# Patient Record
Sex: Male | Born: 1995 | Race: White | Hispanic: No | Marital: Single | Smoking: Never smoker
Health system: Southern US, Community
[De-identification: ages and names within clinical notes are randomized; demographics above are authoritative.]

## PROBLEM LIST (undated history)

## (undated) ENCOUNTER — Ambulatory Visit: Admission: EM | Source: Home / Self Care

## (undated) DIAGNOSIS — R569 Unspecified convulsions: Secondary | ICD-10-CM

## (undated) HISTORY — PX: NO PAST SURGERIES: SHX2092

## (undated) HISTORY — DX: Unspecified convulsions: R56.9

---
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
## 2020-12-12 ENCOUNTER — Emergency Department
Admission: EM | Admit: 2020-12-12 | Discharge: 2020-12-12 | Disposition: A | Discharge status: Home / Self Care | Payer: No Typology Code available for payment source | Attending: Emergency Medicine | Admitting: Emergency Medicine

## 2020-12-12 ENCOUNTER — Encounter: Payer: Self-pay | Admitting: Emergency Medicine

## 2020-12-12 ENCOUNTER — Other Ambulatory Visit: Payer: Self-pay

## 2020-12-12 DIAGNOSIS — Y33XXXA Other specified events, undetermined intent, initial encounter: Secondary | ICD-10-CM | POA: Insufficient documentation

## 2020-12-12 DIAGNOSIS — H5711 Ocular pain, right eye: Secondary | ICD-10-CM | POA: Diagnosis present

## 2020-12-12 DIAGNOSIS — S0501XA Injury of conjunctiva and corneal abrasion without foreign body, right eye, initial encounter: Secondary | ICD-10-CM | POA: Insufficient documentation

## 2020-12-12 MED ORDER — HYDROCODONE-ACETAMINOPHEN 5-325 MG PO TABS: 1.0000 | ORAL_TABLET | Freq: Four times a day (QID) | ORAL | 0 refills | Status: DC | PRN

## 2020-12-12 MED ORDER — TOBRAMYCIN 0.3 % OP OINT
TOPICAL_OINTMENT | Freq: Once | OPHTHALMIC | Status: DC
  Filled 2020-12-12: qty 3.5

## 2020-12-12 MED ORDER — TOBRAMYCIN 0.3 % OP SOLN: 1.0000 [drp] | Freq: Four times a day (QID) | OPHTHALMIC | 0 refills | Status: AC

## 2020-12-12 MED ORDER — FLUORESCEIN SODIUM 1 MG OP STRP
1.0000 | ORAL_STRIP | Freq: Once | OPHTHALMIC | Status: AC
  Administered 2020-12-12: 06:00:00 1 via OPHTHALMIC
  Filled 2020-12-12: qty 1

## 2020-12-12 MED ORDER — TOBRAMYCIN 0.3 % OP OINT
TOPICAL_OINTMENT | Freq: Four times a day (QID) | OPHTHALMIC | Status: DC
  Filled 2020-12-12: qty 3.5

## 2020-12-12 MED ORDER — ERYTHROMYCIN 5 MG/GM OP OINT: 1.0000 "application " | TOPICAL_OINTMENT | Freq: Three times a day (TID) | OPHTHALMIC | 0 refills | Status: AC

## 2020-12-12 MED ORDER — TETRACAINE HCL 0.5 % OP SOLN
2.0000 [drp] | Freq: Once | OPHTHALMIC | Status: AC
  Administered 2020-12-12: 2 [drp] via OPHTHALMIC
  Filled 2020-12-12: qty 4

## 2020-12-12 MED ORDER — ERYTHROMYCIN 5 MG/GM OP OINT: TOPICAL_OINTMENT | Freq: Once | OPHTHALMIC | Status: DC

## 2020-12-12 NOTE — ED Triage Notes (Signed)
Pt to triage via w/c, appears uncomfortable; st awoke yesterday am with "haze" over rt eye; put contacts in and pain increased throughout the day; removed contact prior to bed; awoke PTA with sharp pain to rt eye

## 2020-12-12 NOTE — ED Provider Notes (Signed)
[facility] Emergency Department Provider Note  Time seen: 4:48 AM  I have reviewed the triage vital signs and the nursing notes.   HISTORY  Chief Complaint Eye Pain   HPI Terry Pope is a 24 y.o. male with no past medical history presents to the emergency department for right eye pain.  According to the patient yesterday he awoke with a white haze in his right eye vision and some mild discomfort to the right eye.  Patient states he does not have his glasses with him so he wore contacts yesterday and today.  Patient states today the pain became sharp and increased throughout the day.  Patient took his contacts out but still having severe pain in the right eye.  Patient presents to the emergency department for significant pain.  States he has removed his contact lenses.   History reviewed. No pertinent past medical history.  There are no problems to display for this patient.   History reviewed. No pertinent surgical history.  Prior to Admission medications   Not on File    No Known Allergies  No family history on file.  Social History Social History   Tobacco Use  . Smoking status: Never Smoker  . Smokeless tobacco: Never Used    Review of Systems Constitutional: Negative for fever. Eyes: Significant pain in right eye. ENT: Negative for recent illness Cardiovascular: Negative for chest pain. Respiratory: Negative for shortness of breath. Gastrointestinal: Negative for abdominal pain Musculoskeletal: Negative for musculoskeletal complaints Neurological: Negative for headache All other ROS negative  ____________________________________________   PHYSICAL EXAM:  VITAL SIGNS: ED Triage Vitals  Enc Vitals Group     BP 12/12/20 0412 134/67     Pulse Rate 12/12/20 0412 (!) 50     Resp 12/12/20 0412 18     Temp 12/12/20 0412 98.1 F (36.7 C)     Temp Source 12/12/20 0412 Oral     SpO2 12/12/20 0412 100 %     Weight 12/12/20 0409 168  lb (76.2 kg)     Height 12/12/20 0409 5\' 8"  (1.727 m)     Head Circumference --      Peak Flow --      Pain Score 12/12/20 0409 8     Pain Loc --      Pain Edu? --      Excl. in GC? --    Constitutional: Alert and oriented. Well appearing and in no distress. Eyes: Unable to look at eyes due to pain keep his eye closed shut.  Was able to place tetracaine in the eye, periods of conjunctival injection.  Remainder of the eye exam pending. ENT      Head: Normocephalic and atraumatic.      Mouth/Throat: Mucous membranes are moist. Cardiovascular: Normal rate, regular rhythm Respiratory: Normal respiratory effort without tachypnea nor retractions. Breath sounds are clear Gastrointestinal: Soft and nontender. No distention Musculoskeletal: Nontender with normal range of motion in all extremities.  Neurologic:  Normal speech and language. No gross focal neurologic deficits  Skin:  Skin is warm, dry and intact.  Psychiatric: Mood and affect are normal  ____________________________________________   INITIAL IMPRESSION / ASSESSMENT AND PLAN / ED COURSE  Pertinent labs & imaging results that were available during my care of the patient were reviewed by me and considered in my medical decision making (see chart for details).   Patient presents to the emergency department for severe right eye pain.  States yesterday he developed a  white film over the eye continue to wear contacts yesterday and today now the pain has become severe.  Was able to take the contacts out but continued to have severe pain in the right eye.  Differential would include corneal abrasion, corneal ulceration, acute angle-closure glaucoma.  I have placed tetracaine in the eye to allow for better exam.  We will check tonometry pressures as well as dye the eye with fluorescein and Woods lamp exam.  Tonometry pressures between 5 and 9 obtained in the right eye.  After fluorescein staining patient has a fairly significant scleral  abrasion on each side of the cornea and appears to have a small corneal abrasion to the lower left aspect of the cornea.  No sign of corneal ulceration.  Patient states resolution of pain following tetracaine application.  We will place the patient on erythromycin ointment as well as tobramycin drops as the patient wears contact lenses.  I discussed with the patient avoiding contact lenses for the next 2 weeks.  I also discussed call the number provided for ophthalmology to arrange an eye exam within the next 48 hours.  Discussed return precautions.  Patient agreeable to plan of care.  We will discharge with short course of pain medication.  [name] was evaluated in Emergency Department on 12/12/2020 for the symptoms described in the history of present illness. He was evaluated in the context of the global COVID-19 pandemic, which necessitated consideration that the patient might be at risk for infection with the SARS-CoV-2 virus that causes COVID-19. Institutional protocols and algorithms that pertain to the evaluation of patients at risk for COVID-19 are in a state of rapid change based on information released by regulatory bodies including the CDC and federal and state organizations. These policies and algorithms were followed during the patient's care in the ED.  ____________________________________________   FINAL CLINICAL IMPRESSION(S) / ED DIAGNOSES  Corneal abrasion   Minna Antis, MD 12/12/20 [phone]

## 2020-12-12 NOTE — Discharge Instructions (Signed)
Please call the number provided for ophthalmology to arrange a follow-up appointment either today or tomorrow for an eye examination.  Please use your antibiotic drops and ointment as prescribed.  Please take your pain medication as needed, as written.  Return to the emergency department for any worsening pain, visual deficits, or any other symptom personally concerning to yourself.

## 2021-04-25 ENCOUNTER — Encounter: Payer: Self-pay | Admitting: Internal Medicine

## 2021-04-25 ENCOUNTER — Other Ambulatory Visit: Payer: Self-pay

## 2021-04-25 ENCOUNTER — Ambulatory Visit (INDEPENDENT_AMBULATORY_CARE_PROVIDER_SITE_OTHER): Payer: No Typology Code available for payment source | Admitting: Internal Medicine

## 2021-04-25 DIAGNOSIS — R5383 Other fatigue: Secondary | ICD-10-CM | POA: Diagnosis not present

## 2021-04-25 DIAGNOSIS — M5442 Lumbago with sciatica, left side: Secondary | ICD-10-CM | POA: Diagnosis not present

## 2021-04-25 DIAGNOSIS — Z8619 Personal history of other infectious and parasitic diseases: Secondary | ICD-10-CM

## 2021-04-25 DIAGNOSIS — G479 Sleep disorder, unspecified: Secondary | ICD-10-CM

## 2021-04-25 DIAGNOSIS — Z8616 Personal history of COVID-19: Secondary | ICD-10-CM

## 2021-04-25 LAB — CBC WITH DIFFERENTIAL/PLATELET
Basophils Absolute: 0 10*3/uL (ref 0.0–0.1)
Basophils Relative: 0.2 % (ref 0.0–3.0)
Eosinophils Absolute: 0 10*3/uL (ref 0.0–0.7)
Eosinophils Relative: 0 % (ref 0.0–5.0)
HCT: 45.2 % (ref 39.0–52.0)
Hemoglobin: 14.9 g/dL (ref 13.0–17.0)
Lymphocytes Relative: 17.3 % (ref 12.0–46.0)
Lymphs Abs: 1 10*3/uL (ref 0.7–4.0)
MCHC: 32.9 g/dL (ref 30.0–36.0)
MCV: 86.3 fl (ref 78.0–100.0)
Monocytes Absolute: 0.1 10*3/uL (ref 0.1–1.0)
Monocytes Relative: 1.9 % — ABNORMAL LOW (ref 3.0–12.0)
Neutro Abs: 4.5 10*3/uL (ref 1.4–7.7)
Neutrophils Relative %: 80.6 % — ABNORMAL HIGH (ref 43.0–77.0)
Platelets: 242 10*3/uL (ref 150.0–400.0)
RBC: 5.24 Mil/uL (ref 4.22–5.81)
RDW: 13.6 % (ref 11.5–15.5)
WBC: 5.6 10*3/uL (ref 4.0–10.5)

## 2021-04-25 LAB — COMPREHENSIVE METABOLIC PANEL
ALT: 17 U/L (ref 0–53)
AST: 19 U/L (ref 0–37)
Albumin: 4.9 g/dL (ref 3.5–5.2)
Alkaline Phosphatase: 51 U/L (ref 39–117)
BUN: 15 mg/dL (ref 6–23)
CO2: 30 mEq/L (ref 19–32)
Calcium: 10 mg/dL (ref 8.4–10.5)
Chloride: 102 mEq/L (ref 96–112)
Creatinine, Ser: 1.11 mg/dL (ref 0.40–1.50)
GFR: 93.71 mL/min (ref >60.00)
Glucose, Bld: 100 mg/dL — ABNORMAL HIGH (ref 70–99)
Potassium: 4.5 mEq/L (ref 3.5–5.1)
Sodium: 139 mEq/L (ref 135–145)
Total Bilirubin: 0.3 mg/dL (ref 0.2–1.2)
Total Protein: 7.3 g/dL (ref 6.0–8.3)

## 2021-04-25 LAB — TSH: TSH: 1.45 u[IU]/mL (ref 0.35–4.50)

## 2021-04-25 NOTE — Progress Notes (Signed)
Patient ID: Terry Pope, male   DOB: 1996-12-05, 25 y.o.   MRN: [id number]   Subjective:    Patient ID: Terry Pope, male    DOB: 06-Apr-1996, 25 y.o.   MRN: [id number]  HPI This visit occurred during the SARS-CoV-2 public health emergency.  Safety protocols were in place, including screening questions prior to the visit, additional usage of staff PPE, and extensive cleaning of exam room while observing appropriate contact time as indicated for disinfecting solutions.  Patient here to establish care.  He is due to graduate in one week.  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  Works out daily.  No chest pain or sob with increased activity or exertion.  No acid reflux.  No abdominal pain.  He does report that he was working out five days ago.  Working with [employer].  Felt his back pop.  Left side felt numb.  Numbness lasted 3-5 minutes.  Pain in left buttock and extending down leg.  Worse with standing.  Went to acute care.  Xray - unrevealing per report.  Given prednisone taper.  Is some better.  Pain worse with going from sitting to standing.  Also reports that he has had covid three times - has tested positive.  Symptoms with covid - fever, ears hurt, head stuffiness and sore throat.  He also reports that one time per month  - develops sore throat and feels bad for one week.  Discussed recurring infections.  Discussed ENT referral.  Notices occasional night sweats.  No increased cough or congestion.  Had questions about covid vaccine.  Planning to travel out of the country.  Takes melatonin to help with sleep.  Has also noticed a lump - left side abdomen. Present maybe for one year.  Not tender.  May be a little larger.   Past Medical History:  Diagnosis Date  . Seizures (HCC)    AGE 59 AND UNDER - febrile   Past Surgical History:  Procedure Laterality Date  . NO PAST SURGERIES     Family History  Problem Relation Age of Onset  . Asthma Mother   . Hypertension Maternal Grandmother   . Cancer  Maternal Grandfather   . Hearing loss Paternal Grandfather   . Hyperlipidemia Paternal Grandfather   . Hypertension Paternal Grandfather   . Stroke Paternal Grandfather    Social History   Socioeconomic History  . Marital status: Single    Spouse name: Not on file  . Number of children: Not on file  . Years of education: Not on file  . Highest education level: Not on file  Occupational History  . Not on file  Tobacco Use  . Smoking status: Never Smoker  . Smokeless tobacco: Never Used  Vaping Use  . Vaping Use: Never used  Substance and Sexual Activity  . Alcohol use: Not Currently  . Drug use: Never  . Sexual activity: Not Currently  Other Topics Concern  . Not on file  Social History Narrative  . Not on file   Social Determinants of Health   Financial Resource Strain: Not on file  Food Insecurity: Not on file  Transportation Needs: Not on file  Physical Activity: Not on file  Stress: Not on file  Social Connections: Not on file    Outpatient Encounter Medications as of 04/25/2021  Medication Sig  . [DISCONTINUED] HYDROcodone-acetaminophen (NORCO/VICODIN) 5-325 MG tablet Take 1 tablet by mouth every 6 (six) hours as needed for moderate pain. (Patient not taking: Reported on 04/25/2021)  No facility-administered encounter medications on file as of 04/25/2021.    Review of Systems  Constitutional: Negative for appetite change and unexpected weight change.  HENT: Negative for congestion and sinus pressure.   Respiratory: Negative for cough, chest tightness and shortness of breath.   Cardiovascular: Negative for chest pain, palpitations and leg swelling.  Gastrointestinal: Negative for abdominal pain, diarrhea, nausea and vomiting.  Genitourinary: Negative for difficulty urinating and dysuria.  Musculoskeletal: Positive for back pain. Negative for joint swelling and myalgias.  Skin: Negative for color change and rash.  Neurological: Negative for dizziness,  light-headedness and headaches.  Psychiatric/Behavioral: Negative for agitation and dysphoric mood.       Objective:    Physical Exam Vitals reviewed.  Constitutional:      General: He is not in acute distress.    Appearance: Normal appearance. He is well-developed.  HENT:     Head: Normocephalic and atraumatic.     Right Ear: External ear normal.     Left Ear: External ear normal.  Eyes:     General: No scleral icterus.       Right eye: No discharge.        Left eye: No discharge.     Conjunctiva/sclera: Conjunctivae normal.  Cardiovascular:     Rate and Rhythm: Normal rate and regular rhythm.  Pulmonary:     Effort: Pulmonary effort is normal. No respiratory distress.     Breath sounds: Normal breath sounds.  Abdominal:     General: Bowel sounds are normal.     Palpations: Abdomen is soft.     Tenderness: There is no abdominal tenderness.  Musculoskeletal:        General: No swelling or tenderness.     Cervical back: Neck supple. No tenderness.  Lymphadenopathy:     Cervical: No cervical adenopathy.  Skin:    Findings: No erythema or rash.     Comments: Increased soft tissue  - localized - left side.  Non tender.  No erythema.   Neurological:     Mental Status: He is alert.  Psychiatric:        Mood and Affect: Mood normal.        Behavior: Behavior normal.     BP 118/70   Pulse 72   Temp 98 F (36.7 C)   Resp 16   Ht 5\' 8"  (1.727 m)   Wt 181 lb (82.1 kg)   SpO2 99%   BMI 27.52 kg/m  Wt Readings from Last 3 Encounters:  04/25/21 181 lb (82.1 kg)  12/12/20 168 lb (76.2 kg)        Assessment & Plan:   Problem List Items Addressed This Visit    Difficulty sleeping    Taking melatonin.  Helps.        Fatigue   Relevant Orders   CBC with Differential/Platelet (Completed)   Comprehensive metabolic panel (Completed)   TSH (Completed)   Frequent infections    Frequent infections with recurring sore throat and recurring covid infections.  Check  routine labs.  Check cbc.  Follow.  Discussed covid vaccine.  Questions answered.       Relevant Orders   Ambulatory referral to ENT   History of COVID-19    History of recurring covid infections.  Better now.  Questions answered about covid vaccine.        Low back pain    Low back/buttock/leg pain as outlined.  Evaluated at acute care.  On steroid taper.  Follow.  Obtain xray report.           Dale Byron, MD

## 2021-04-26 ENCOUNTER — Other Ambulatory Visit: Payer: Self-pay | Admitting: Internal Medicine

## 2021-04-26 DIAGNOSIS — Z8619 Personal history of other infectious and parasitic diseases: Secondary | ICD-10-CM

## 2021-04-26 NOTE — Progress Notes (Signed)
Order placed for f/u cbc.   

## 2021-04-29 ENCOUNTER — Encounter: Payer: Self-pay | Admitting: Internal Medicine

## 2021-04-29 DIAGNOSIS — M545 Low back pain, unspecified: Secondary | ICD-10-CM | POA: Insufficient documentation

## 2021-04-29 DIAGNOSIS — Z8616 Personal history of COVID-19: Secondary | ICD-10-CM | POA: Insufficient documentation

## 2021-04-29 DIAGNOSIS — G479 Sleep disorder, unspecified: Secondary | ICD-10-CM | POA: Insufficient documentation

## 2021-04-29 DIAGNOSIS — Z8619 Personal history of other infectious and parasitic diseases: Secondary | ICD-10-CM | POA: Insufficient documentation

## 2021-04-29 NOTE — Assessment & Plan Note (Signed)
History of recurring covid infections.  Better now.  Questions answered about covid vaccine.

## 2021-04-29 NOTE — Assessment & Plan Note (Signed)
Taking melatonin.  Helps.

## 2021-04-29 NOTE — Assessment & Plan Note (Signed)
Frequent infections with recurring sore throat and recurring covid infections.  Check routine labs.  Check cbc.  Follow.  Discussed covid vaccine.  Questions answered.

## 2021-04-29 NOTE — Assessment & Plan Note (Signed)
Low back/buttock/leg pain as outlined.  Evaluated at acute care.  On steroid taper.  Follow.  Obtain xray report.

## 2021-05-24 ENCOUNTER — Other Ambulatory Visit (INDEPENDENT_AMBULATORY_CARE_PROVIDER_SITE_OTHER): Payer: No Typology Code available for payment source

## 2021-05-24 ENCOUNTER — Other Ambulatory Visit: Payer: Self-pay

## 2021-05-24 DIAGNOSIS — Z8619 Personal history of other infectious and parasitic diseases: Secondary | ICD-10-CM | POA: Diagnosis not present

## 2021-05-24 LAB — CBC WITH DIFFERENTIAL/PLATELET
Basophils Absolute: 0 10*3/uL (ref 0.0–0.1)
Basophils Relative: 0.6 % (ref 0.0–3.0)
Eosinophils Absolute: 0.1 10*3/uL (ref 0.0–0.7)
Eosinophils Relative: 1.2 % (ref 0.0–5.0)
HCT: 43.8 % (ref 39.0–52.0)
Hemoglobin: 14.8 g/dL (ref 13.0–17.0)
Lymphocytes Relative: 43.7 % (ref 12.0–46.0)
Lymphs Abs: 2.7 10*3/uL (ref 0.7–4.0)
MCHC: 33.8 g/dL (ref 30.0–36.0)
MCV: 85.1 fl (ref 78.0–100.0)
Monocytes Absolute: 0.4 10*3/uL (ref 0.1–1.0)
Monocytes Relative: 6.6 % (ref 3.0–12.0)
Neutro Abs: 2.9 10*3/uL (ref 1.4–7.7)
Neutrophils Relative %: 47.9 % (ref 43.0–77.0)
Platelets: 229 10*3/uL (ref 150.0–400.0)
RBC: 5.14 Mil/uL (ref 4.22–5.81)
RDW: 13.1 % (ref 11.5–15.5)
WBC: 6.1 10*3/uL (ref 4.0–10.5)

## 2021-05-29 ENCOUNTER — Telehealth: Payer: Self-pay

## 2021-05-29 NOTE — Telephone Encounter (Signed)
Number is not in service. Could not leave a message to call back.

## 2021-06-07 ENCOUNTER — Telehealth: Payer: Self-pay

## 2021-06-07 NOTE — Telephone Encounter (Signed)
Called listed Cell number and left a message to call back.

## 2021-09-24 ENCOUNTER — Telehealth: Payer: Self-pay | Admitting: Internal Medicine

## 2021-09-24 NOTE — Telephone Encounter (Signed)
"  Rejection Reason - Patient did not respond" Terry Pope said on Sep 24, 2021 9:01 AM  Msg from [facility]

## 2021-10-26 ENCOUNTER — Ambulatory Visit: Payer: No Typology Code available for payment source | Admitting: Internal Medicine

## 2024-06-08 ENCOUNTER — Encounter: Payer: Self-pay | Admitting: Internal Medicine

## 2024-06-08 DIAGNOSIS — M79604 Pain in right leg: Secondary | ICD-10-CM | POA: Insufficient documentation
